# Patient Record
Sex: Female | Born: 1995
Health system: Southern US, Community
[De-identification: ages and names within clinical notes are randomized; demographics above are authoritative.]

## PROBLEM LIST (undated history)

## (undated) DIAGNOSIS — F419 Anxiety disorder, unspecified: Secondary | ICD-10-CM

## (undated) HISTORY — DX: Anxiety disorder, unspecified: F41.9

---
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
## 2013-01-30 ENCOUNTER — Other Ambulatory Visit: Payer: Self-pay | Admitting: Physician Assistant

## 2013-01-30 ENCOUNTER — Ambulatory Visit
Admission: RE | Admit: 2013-01-30 | Discharge: 2013-01-30 | Disposition: A | Discharge status: Home / Self Care | Payer: 59 | Source: Ambulatory Visit | Attending: Physician Assistant | Admitting: Physician Assistant

## 2013-01-30 DIAGNOSIS — R05 Cough: Secondary | ICD-10-CM

## 2014-11-07 IMAGING — CR DG CHEST 2V
2 series · 2 of 2 positions shown · non-contrast
Comparison: 05/09/2004.

CLINICAL DATA: 17-year-old female with cough. Left side chest pain.

EXAM:
CHEST  2 VIEW

[view not recorded (1 of 2)]
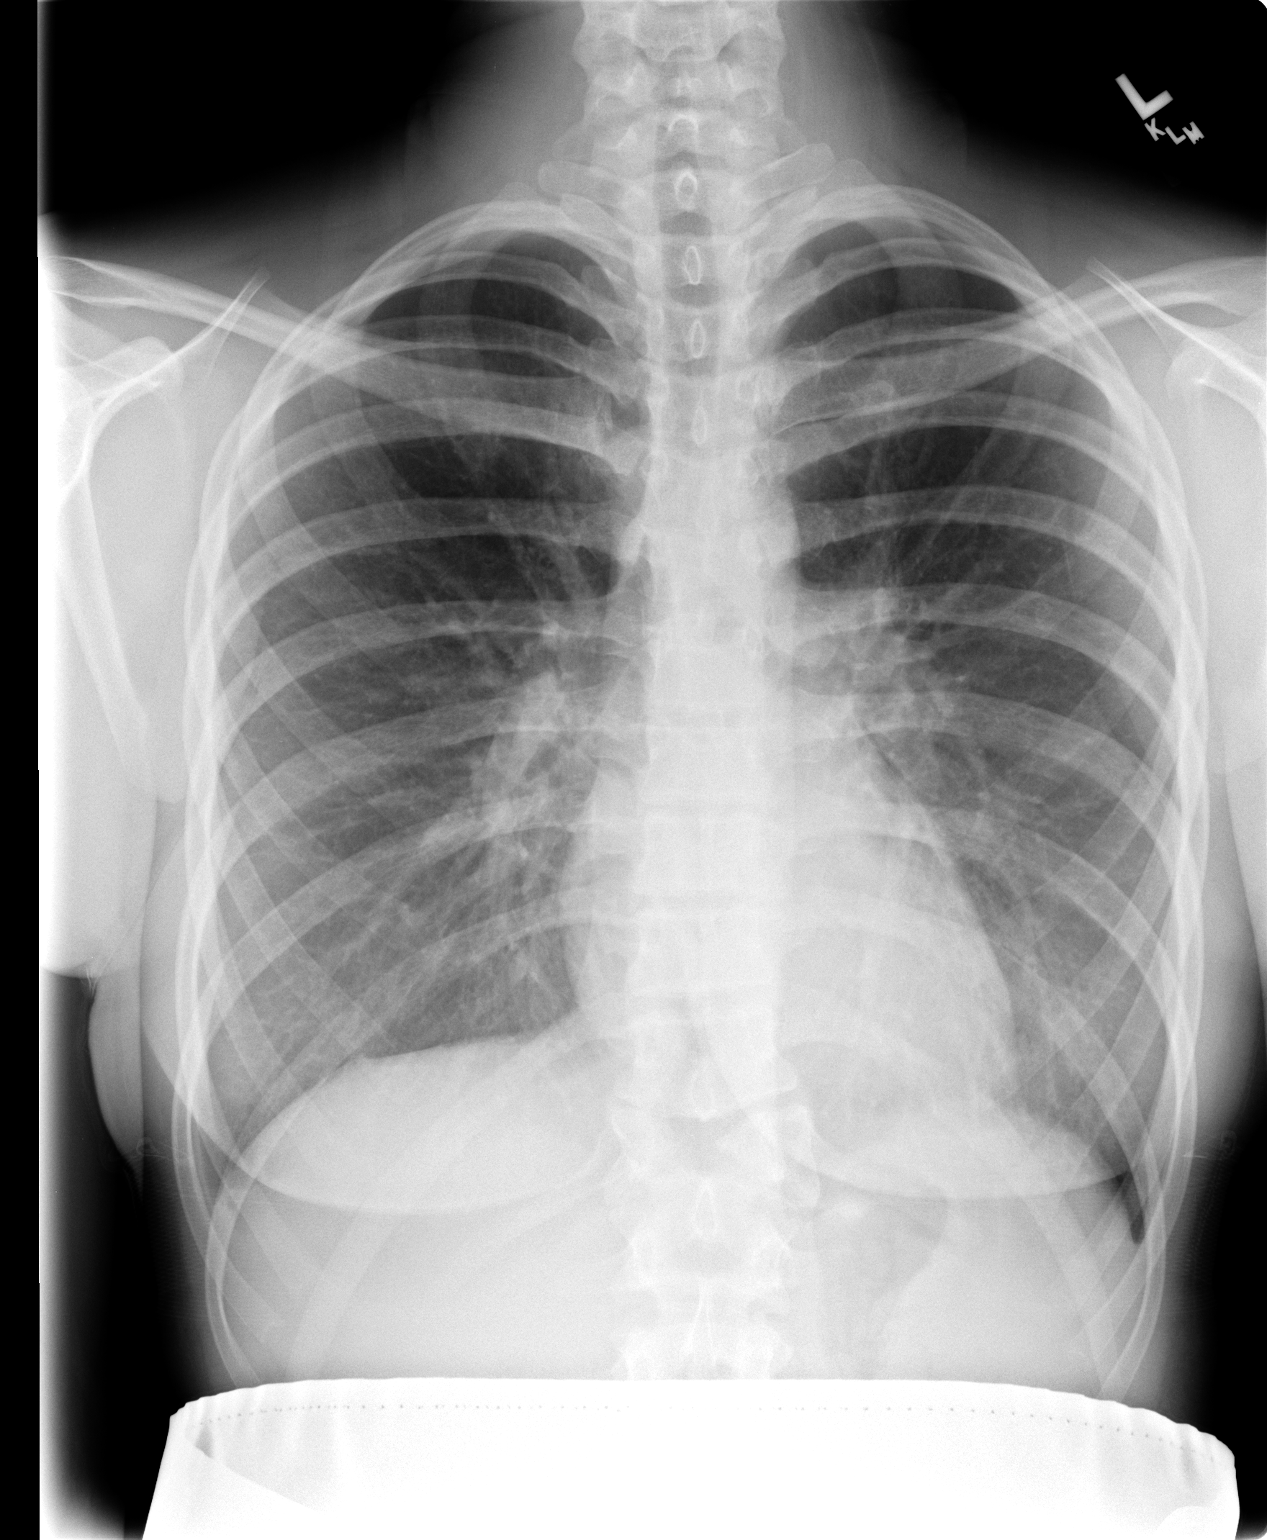

[view not recorded (2 of 2)]
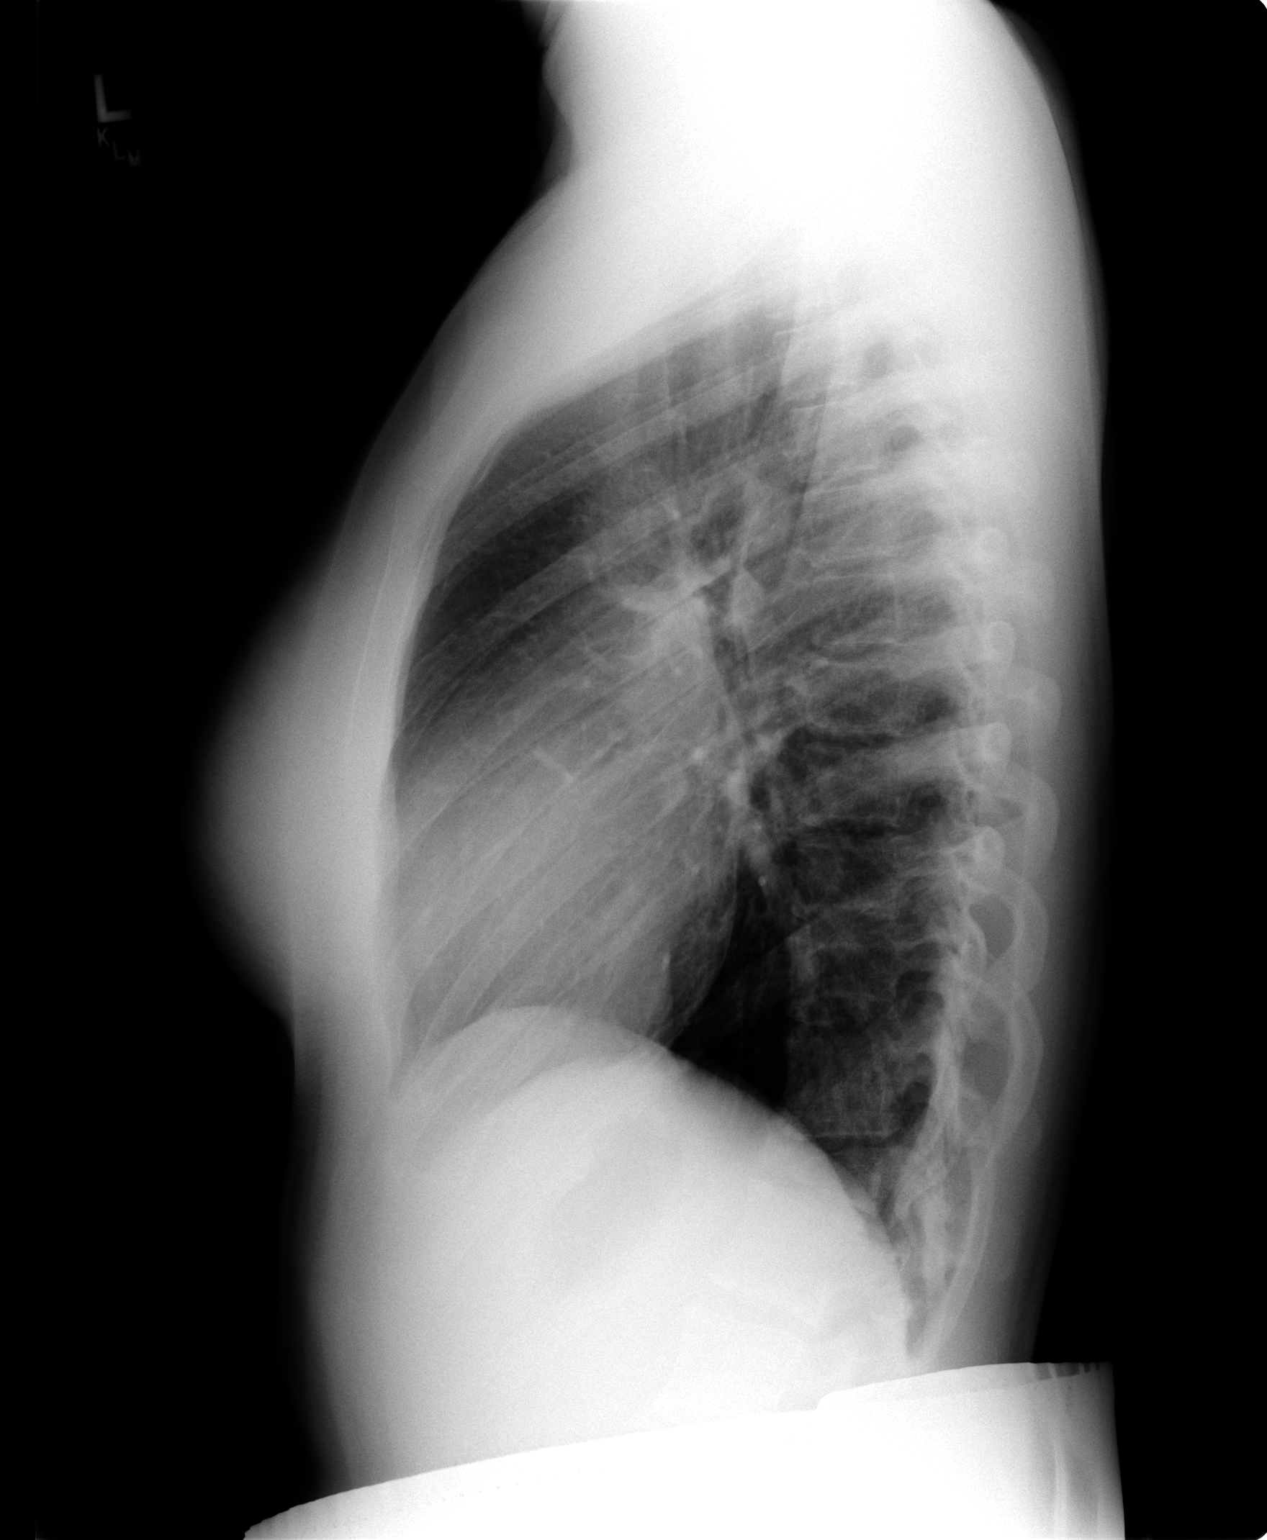

[2 of 2 positions shown; findings below may reference images not displayed]

FINDINGS: The medial left hemidiaphragm is mildly obscured on the frontal
view. No confluent pulmonary opacity is evident on the lateral. Lung
volumes remain normal. Normal cardiac size and mediastinal contours.
Visualized tracheal air column is within normal limits. No
pneumothorax or pleural effusion. Congenital butterfly vertebra at
T12 re- identified.
IMPRESSION: 1. Subtle increased opacity at the left lung base on the frontal
view, suspicious for mild or developing bronchopneumonia. 2.
Incidental congenital T12 butterfly vertebra.

These results will be called to the ordering clinician or
representative by the Radiologist Assistant, and communication
documented in the PACS Dashboard.

## 2014-11-29 ENCOUNTER — Other Ambulatory Visit (HOSPITAL_COMMUNITY)
Admission: RE | Admit: 2014-11-29 | Discharge: 2014-11-29 | Disposition: A | Discharge status: Home / Self Care | Payer: 59 | Source: Ambulatory Visit | Attending: Family Medicine | Admitting: Family Medicine

## 2014-11-29 ENCOUNTER — Other Ambulatory Visit: Payer: Self-pay | Admitting: Family Medicine

## 2014-11-29 DIAGNOSIS — Z124 Encounter for screening for malignant neoplasm of cervix: Secondary | ICD-10-CM | POA: Diagnosis present

## 2014-12-01 LAB — CYTOLOGY - PAP

## 2015-05-24 MED FILL — LARIN FE 1.5-30 TABLET: 1.5-30 | 84 days supply | Qty: 84 | Fill #0 | Status: TO

## 2015-07-10 DIAGNOSIS — R509 Fever, unspecified: Secondary | ICD-10-CM | POA: Diagnosis not present

## 2015-07-10 DIAGNOSIS — R05 Cough: Secondary | ICD-10-CM | POA: Diagnosis not present

## 2015-07-12 DIAGNOSIS — J209 Acute bronchitis, unspecified: Secondary | ICD-10-CM | POA: Diagnosis not present

## 2015-07-13 DIAGNOSIS — H1032 Unspecified acute conjunctivitis, left eye: Secondary | ICD-10-CM | POA: Diagnosis not present

## 2015-12-14 DIAGNOSIS — J039 Acute tonsillitis, unspecified: Secondary | ICD-10-CM | POA: Diagnosis not present

## 2015-12-16 DIAGNOSIS — Z01419 Encounter for gynecological examination (general) (routine) without abnormal findings: Secondary | ICD-10-CM | POA: Diagnosis not present

## 2015-12-16 DIAGNOSIS — Z113 Encounter for screening for infections with a predominantly sexual mode of transmission: Secondary | ICD-10-CM | POA: Diagnosis not present

## 2016-06-26 DIAGNOSIS — Z113 Encounter for screening for infections with a predominantly sexual mode of transmission: Secondary | ICD-10-CM | POA: Diagnosis not present

## 2016-07-02 DIAGNOSIS — R350 Frequency of micturition: Secondary | ICD-10-CM | POA: Diagnosis not present

## 2017-02-20 DIAGNOSIS — Z23 Encounter for immunization: Secondary | ICD-10-CM | POA: Diagnosis not present

## 2021-08-15 ENCOUNTER — Encounter: Payer: Self-pay | Admitting: Allergy and Immunology

## 2021-08-15 ENCOUNTER — Ambulatory Visit (INDEPENDENT_AMBULATORY_CARE_PROVIDER_SITE_OTHER): Payer: 59 | Admitting: Allergy and Immunology

## 2021-08-15 VITALS — BP 138/88 | HR 86 | Temp 97.6°F | Resp 20 | Ht 66.0 in | Wt 161.0 lb

## 2021-08-15 DIAGNOSIS — T781XXA Other adverse food reactions, not elsewhere classified, initial encounter: Secondary | ICD-10-CM

## 2021-08-15 MED ORDER — EPINEPHRINE 0.3 MG/0.3ML IJ SOAJ: 0.3000 mg | INTRAMUSCULAR | 1 refills | Status: AC | PRN

## 2021-08-15 NOTE — Progress Notes (Signed)
?[facility] - [facility] -  - [place] -  ? ? ?NEW PATIENT NOTE ? ?Referring Provider: No ref. provider found ?Primary Provider: Pcp, No ?Date of office visit: 08/15/2021 ?   ?Subjective:  ? ?Chief Complaint:  Michelle Spencer (DOB: [birth year]) is a 26 y.o. female who presents to the clinic on 08/15/2021 with a chief complaint of Allergy Testing (Food: Shellfish - Shrimp itch) ?.    ? ?HPI: Michelle Spencer presents to this clinic in evaluation of possible food allergy. ? ?She states that approximately 8 years ago she developed problems with hand and feet itching and redness whenever she ate shrimp.  This was a repetitive issue for 3 different exposures and since that point in time she has been shellfish free.  Thus, she has not had the consumption of any shellfish for 8 years.  She is interested in working through this issue in more detail. ? ?Past Medical History:  ?Diagnosis Date  ? Anxiety   ? ? ?Past Surgical History:  ?Procedure Laterality Date  ? KNEE SURGERY Right 2008  ? ? ?Allergies as of 08/15/2021   ? ?   Reactions  ? Shellfish Allergy Itching  ? ?  ? ?  ?Medication List  ? ? ?ALPRAZolam 1 MG tablet ?Commonly known as: Prudy Feeler ?Take 1 tablet by mouth as needed. ?  ?diazepam 2 MG tablet ?Commonly known as: VALIUM ?Take 2 mg by mouth daily. ?  ?etonogestrel-ethinyl estradiol 0.12-0.015 MG/24HR vaginal ring ?Commonly known as: NUVARING ?Place 1 each vaginally as directed. ?  ?FLUoxetine 20 MG capsule ?Commonly known as: PROZAC ?Take 1 capsule by mouth every morning. ?  ? ?Review of systems negative except as noted in HPI / PMHx or noted below: ? ?Review of Systems  ?Constitutional: Negative.   ?HENT: Negative.    ?Eyes: Negative.   ?Respiratory: Negative.    ?Cardiovascular: Negative.   ?Gastrointestinal: Negative.   ?Genitourinary: Negative.   ?Musculoskeletal: Negative.   ?Skin: Negative.   ?Neurological: Negative.   ?Endo/Heme/Allergies: Negative.   ?Psychiatric/Behavioral: Negative.    ? ?Family  History  ?Problem Relation Age of Onset  ? Allergic rhinitis Father   ? ? ?Social History  ? ?Socioeconomic History  ? Marital status: Single  ?  Spouse name: Not on file  ? Number of children: Not on file  ? Years of education: Not on file  ? Highest education level: Not on file  ?Occupational History  ? Not on file  ?Tobacco Use  ? Smoking status: Never  ?  Passive exposure: Never  ? Smokeless tobacco: Never  ?Vaping Use  ? Vaping Use: Never used  ?Substance and Sexual Activity  ? Alcohol use: Yes  ?  Alcohol/week: 2.0 standard drinks  ?  Types: 2 Cans of beer per week  ?  Comment: occ  ? Drug use: Never  ? Sexual activity: Yes  ?  Birth control/protection: I.U.D.  ?Other Topics Concern  ? Not on file  ?Social History Narrative  ? Not on file  ? ? ?Environmental and Social history ? ?Lives in a house with a dry environment, dog located inside the household, no carpet in the bedroom, plastic on the bed, plastic on the pillow, and no smoking ongoing with inside the household.  She works as a Proofreader with computer work.  She will be attending [employer] for a fellowship this coming year. ? ?Objective:  ? ?Vitals:  ? 08/15/21 1403  ?BP: 138/88  ?Pulse: 86  ?Resp: 20  ?  Temp: 97.6 ?F (36.4 ?C)  ?SpO2: 99%  ? ?Height: 5\' 6"  (167.6 cm) ?Weight: 161 lb (73 kg) ? ?Physical Exam ?Constitutional:   ?   Appearance: She is not diaphoretic.  ?HENT:  ?   Head: Normocephalic.  ?   Right Ear: Tympanic membrane, ear canal and external ear normal.  ?   Left Ear: Tympanic membrane, ear canal and external ear normal.  ?   Nose: Nose normal. No mucosal edema or rhinorrhea.  ?   Mouth/Throat:  ?   Pharynx: Uvula midline. No oropharyngeal exudate.  ?Eyes:  ?   Conjunctiva/sclera: Conjunctivae normal.  ?Neck:  ?   Thyroid: No thyromegaly.  ?   Trachea: Trachea normal. No tracheal tenderness or tracheal deviation.  ?Cardiovascular:  ?   Rate and Rhythm: Normal rate and regular rhythm.  ?   Heart sounds: Normal heart sounds, S1  normal and S2 normal. No murmur heard. ?Pulmonary:  ?   Effort: No respiratory distress.  ?   Breath sounds: Normal breath sounds. No stridor. No wheezing or rales.  ?Lymphadenopathy:  ?   Head:  ?   Right side of head: No tonsillar adenopathy.  ?   Left side of head: No tonsillar adenopathy.  ?   Cervical: No cervical adenopathy.  ?Skin: ?   Findings: No erythema or rash.  ?   Nails: There is no clubbing.  ?Neurological:  ?   Mental Status: She is alert.  ? ? ?Diagnostics: Allergy skin tests were performed.  She did not demonstrate any hypersensitivity against a screening panel of shellfish. ? ?Assessment and Plan:  ? ? ?1. Adverse food reaction, initial encounter   ? ? ?1.   Blood -shellfish IgE panel ? ?2.   Food challenge?  EpiPen? ? ?Zaeda had negative skin test again shellfish today which is a very good finding and we will further work through this issue by checking for shellfish specific IgE antibodies and if she does not have any we will challenge her to shellfish in the clinic. ? ? , MD ?Allergy / Immunology ?[facility] ?

## 2021-08-15 NOTE — Patient Instructions (Signed)
?  1.   Blood -shellfish IgE panel ? ?2.   Food challenge?  EpiPen? ? ?  ?

## 2021-08-16 ENCOUNTER — Encounter: Payer: Self-pay | Admitting: Allergy and Immunology

## 2021-08-17 LAB — ALLERGEN PROFILE, SHELLFISH
Clam IgE: <0.1 kU/L
F023-IgE Crab: <0.1 kU/L
F080-IgE Lobster: <0.1 kU/L
F290-IgE Oyster: <0.1 kU/L
Scallop IgE: <0.1 kU/L
Shrimp IgE: <0.1 kU/L

## 2021-09-26 ENCOUNTER — Ambulatory Visit: Payer: Self-pay | Admitting: Allergy and Immunology
